# Patient Record
Sex: Female | Born: 2000 | Race: White | Hispanic: No | Marital: Single | Smoking: Never smoker
Health system: Southern US, Community
[De-identification: ages and names within clinical notes are randomized; demographics above are authoritative.]

## PROBLEM LIST (undated history)

## (undated) DIAGNOSIS — I10 Essential (primary) hypertension: Secondary | ICD-10-CM

---

## 2019-08-31 ENCOUNTER — Encounter (HOSPITAL_COMMUNITY): Payer: Self-pay

## 2019-08-31 ENCOUNTER — Other Ambulatory Visit: Payer: Self-pay

## 2019-08-31 ENCOUNTER — Ambulatory Visit (HOSPITAL_COMMUNITY)
Admission: EM | Admit: 2019-08-31 | Discharge: 2019-08-31 | Disposition: A | Discharge status: Home / Self Care | Payer: BC Managed Care – PPO | Attending: Family Medicine | Admitting: Family Medicine

## 2019-08-31 ENCOUNTER — Ambulatory Visit (INDEPENDENT_AMBULATORY_CARE_PROVIDER_SITE_OTHER): Payer: BC Managed Care – PPO

## 2019-08-31 DIAGNOSIS — S52125A Nondisplaced fracture of head of left radius, initial encounter for closed fracture: Secondary | ICD-10-CM

## 2019-08-31 DIAGNOSIS — M25522 Pain in left elbow: Secondary | ICD-10-CM

## 2019-08-31 DIAGNOSIS — M25422 Effusion, left elbow: Secondary | ICD-10-CM

## 2019-08-31 DIAGNOSIS — S8012XA Contusion of left lower leg, initial encounter: Secondary | ICD-10-CM

## 2019-08-31 HISTORY — DX: Essential (primary) hypertension: I10

## 2019-08-31 MED ORDER — HYDROCODONE-ACETAMINOPHEN 5-325 MG PO TABS: 1.0000 | ORAL_TABLET | Freq: Three times a day (TID) | ORAL | 0 refills | Status: AC | PRN

## 2019-08-31 MED ORDER — NAPROXEN 500 MG PO TABS: 500.0000 mg | ORAL_TABLET | Freq: Two times a day (BID) | ORAL | 0 refills | Status: AC

## 2019-08-31 NOTE — ED Triage Notes (Signed)
Pt presents with left elbow injury after landing on it when she was fell off of her skateboard today

## 2019-08-31 NOTE — Discharge Instructions (Addendum)
Please use naproxen twice daily for pain associated with your elbow fracture.  If this does not help to control your pain then it is okay to use hydrocodone up to every 8 hours as needed.  You do not have to take this pain medication if naproxen helps you with your pain control.  Make sure you reach out to [facility] for follow-up and management of your elbow fracture.

## 2019-08-31 NOTE — ED Provider Notes (Signed)
MRN: [id number] DOB: [birth year]  Subjective:   Kathryn Forbes is a 18 y.o. female presenting for suffering a fall today a few hours ago.  Patient was on a skateboard broke her fall with her left elbow and has had severe pain, swelling since then.  She also hit her left leg but denies head trauma.  Denies loss of consciousness, confusion, headache, vision changes, weakness, incontinence, numbness or tingling, difficulty urinating or defecating.  Patient has not taken any medications for pain relief.  She has not had anything to eat since this morning and prefers not to have any medications until she eats dinner.  Denies taking any chronic medications.    No Known Allergies  Past Medical History:  Diagnosis Date  . Hypertension      History reviewed. No pertinent surgical history.  ROS  Objective:   Vitals: BP 135/88 (BP Location: Right Arm)   Pulse 89   Temp 98.4 F (36.9 C) (Oral)   Resp 18   SpO2 100%   Physical Exam Constitutional:      General: She is not in acute distress.    Appearance: Normal appearance. She is well-developed. She is not ill-appearing.  HENT:     Head: Normocephalic and atraumatic.     Nose: Nose normal.     Mouth/Throat:     Mouth: Mucous membranes are moist.     Pharynx: Oropharynx is clear.  Eyes:     General: No scleral icterus.    Extraocular Movements: Extraocular movements intact.     Pupils: Pupils are equal, round, and reactive to light.  Cardiovascular:     Rate and Rhythm: Normal rate.  Pulmonary:     Effort: Pulmonary effort is normal.  Musculoskeletal:     Left elbow: She exhibits decreased range of motion and swelling. She exhibits no deformity and no laceration. Tenderness found. Radial head and lateral epicondyle tenderness noted. No medial epicondyle and no olecranon process tenderness noted.  Skin:    General: Skin is warm and dry.  Neurological:     General: No focal deficit present.     Mental Status: She is alert and  oriented to person, place, and time.     Cranial Nerves: No cranial nerve deficit.     Motor: No weakness.     Coordination: Coordination normal.     Deep Tendon Reflexes: Reflexes normal.  Psychiatric:        Mood and Affect: Mood normal.        Behavior: Behavior normal.        Thought Content: Thought content normal.        Judgment: Judgment normal.     Dg Elbow Complete Left (3+view)  Result Date: 08/31/2019 CLINICAL DATA:  Post fall this morning with persistent left elbow pain. Patient with history of subcutaneous contraceptive device within the upper arm. EXAM: LEFT ELBOW - COMPLETE 3+ VIEW COMPARISON:  None. FINDINGS: There is an acute nondisplaced fracture of the radial head with extension to the adjacent radial- humeral joint and an expected elbow joint effusion. No additional fractures identified. Expected adjacent soft tissue swelling. Linear foreign body within the soft tissues of the inner aspect of the upper arm compatible with implanted contraceptive device. No additional radiopaque foreign body. IMPRESSION: Acute nondisplaced fracture of the radial head with associated elbow joint effusion. Electronically Signed   By: Sandi Mariscal M.D.   On: 08/31/2019 17:02     Assessment and Plan :   1. Closed nondisplaced fracture  of head of left radius, initial encounter   2. Fall from skateboard, initial encounter   3. Pain and swelling of left elbow   4. Contusion of left leg, initial encounter     Patient placed in posterior long-arm splint.  She is to follow-up with Ortho ASAP.  Scheduled naproxen and use hydrocodone for breakthrough pain. Counseled patient on potential for adverse effects with medications prescribed/recommended today, ER and return-to-clinic precautions discussed, patient verbalized understanding.    Wallis Bamberg, [place] 08/31/19 1733

## 2019-09-01 ENCOUNTER — Ambulatory Visit: Payer: BC Managed Care – PPO | Admitting: Orthopaedic Surgery

## 2019-10-09 ENCOUNTER — Other Ambulatory Visit: Payer: Self-pay

## 2019-10-09 DIAGNOSIS — Z20822 Contact with and (suspected) exposure to covid-19: Secondary | ICD-10-CM

## 2019-10-10 LAB — NOVEL CORONAVIRUS, NAA: SARS-CoV-2, NAA: NOT DETECTED

## 2020-02-13 ENCOUNTER — Ambulatory Visit: Payer: BC Managed Care – PPO | Attending: Internal Medicine

## 2020-02-13 DIAGNOSIS — Z23 Encounter for immunization: Secondary | ICD-10-CM

## 2020-02-13 NOTE — Progress Notes (Signed)
   Covid-19 Vaccination Clinic  Name:  Kishia Shackett    MRN: [id number] DOB: 21-Feb-2001  02/13/2020  Ms. Neyer was observed post Covid-19 immunization for 15 minutes without incident. She was provided with Vaccine Information Sheet and instruction to access the V-Safe system.   Ms. Admire was instructed to call 911 with any severe reactions post vaccine: Marland Kitchen Difficulty breathing  . Swelling of face and throat  . A fast heartbeat  . A bad rash all over body  . Dizziness and weakness   Immunizations Administered    Name Date Dose VIS Date Route   Pfizer COVID-19 Vaccine 02/13/2020  6:28 PM 0.3 mL 11/20/2019 Intramuscular   Manufacturer: [employer], [employer]   Lot: [id number]   NDC: [address]-6

## 2020-03-05 ENCOUNTER — Ambulatory Visit: Payer: BC Managed Care – PPO

## 2020-03-07 ENCOUNTER — Ambulatory Visit: Payer: BC Managed Care – PPO | Attending: Internal Medicine

## 2020-03-07 DIAGNOSIS — Z23 Encounter for immunization: Secondary | ICD-10-CM

## 2020-03-07 NOTE — Progress Notes (Signed)
   Covid-19 Vaccination Clinic  Name:  Kathryn Forbes    MRN: [id number] DOB: 17-Oct-2001  03/07/2020  [name] was observed post Covid-19 immunization for 15 minutes without incident. She was provided with Vaccine Information Sheet and instruction to access the V-Safe system.   [name] was instructed to call 911 with any severe reactions post vaccine: Marland Kitchen Difficulty breathing  . Swelling of face and throat  . A fast heartbeat  . A bad rash all over body  . Dizziness and weakness   Immunizations Administered    Name Date Dose VIS Date Route   Pfizer COVID-19 Vaccine 03/07/2020  9:25 AM 0.3 mL 11/20/2019 Intramuscular   Manufacturer: [employer], [employer]   Lot: [id number]   NDC: [address]-2

## 2020-03-16 ENCOUNTER — Ambulatory Visit: Payer: BC Managed Care – PPO

## 2021-04-29 IMAGING — DX DG ELBOW COMPLETE 3+V*L*
4 series · 4 of 4 positions shown · non-contrast
Comparison: None.

CLINICAL DATA: Post fall this morning with persistent left elbow
pain. Patient with history of subcutaneous contraceptive device
within the upper arm.

EXAM:
LEFT ELBOW - COMPLETE 3+ VIEW

[elbow ap]
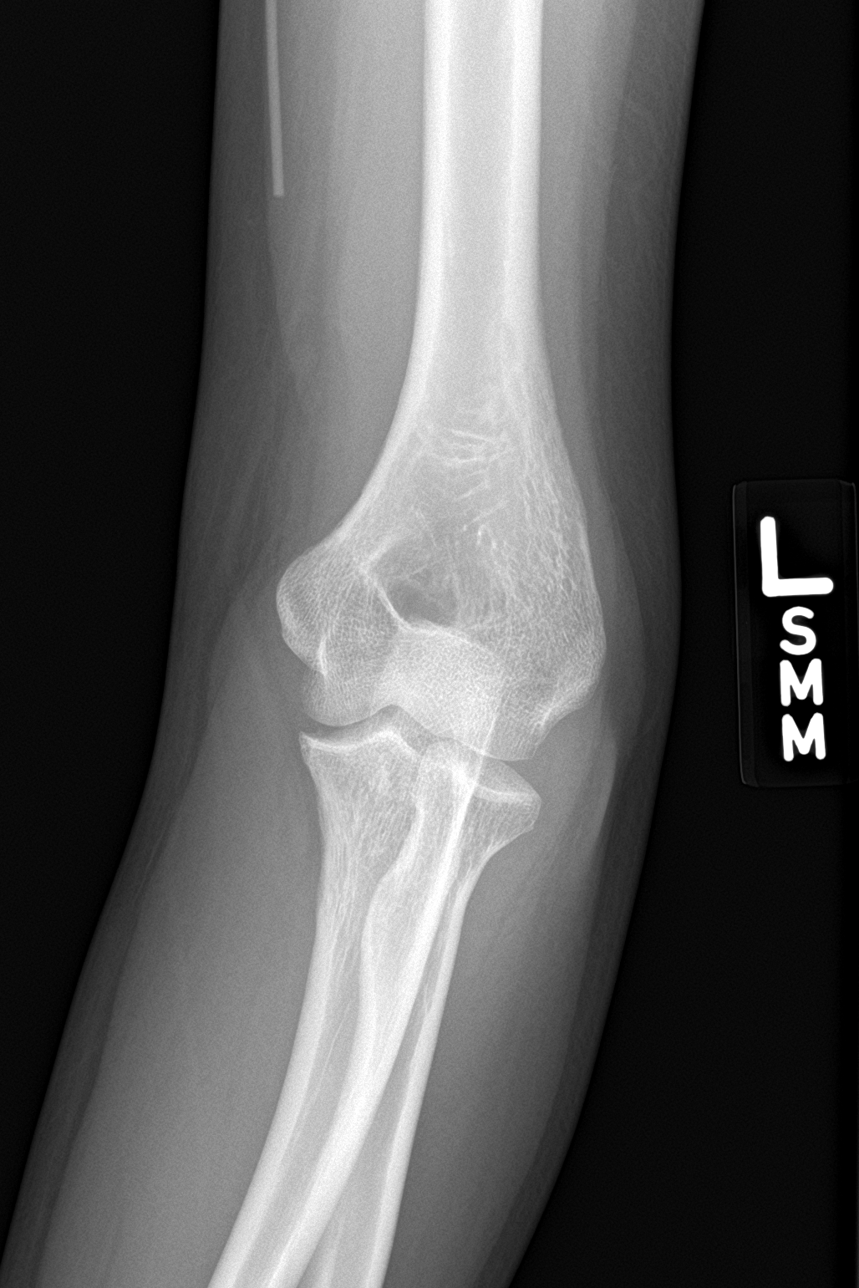

[elbow obl (1 of 2)]
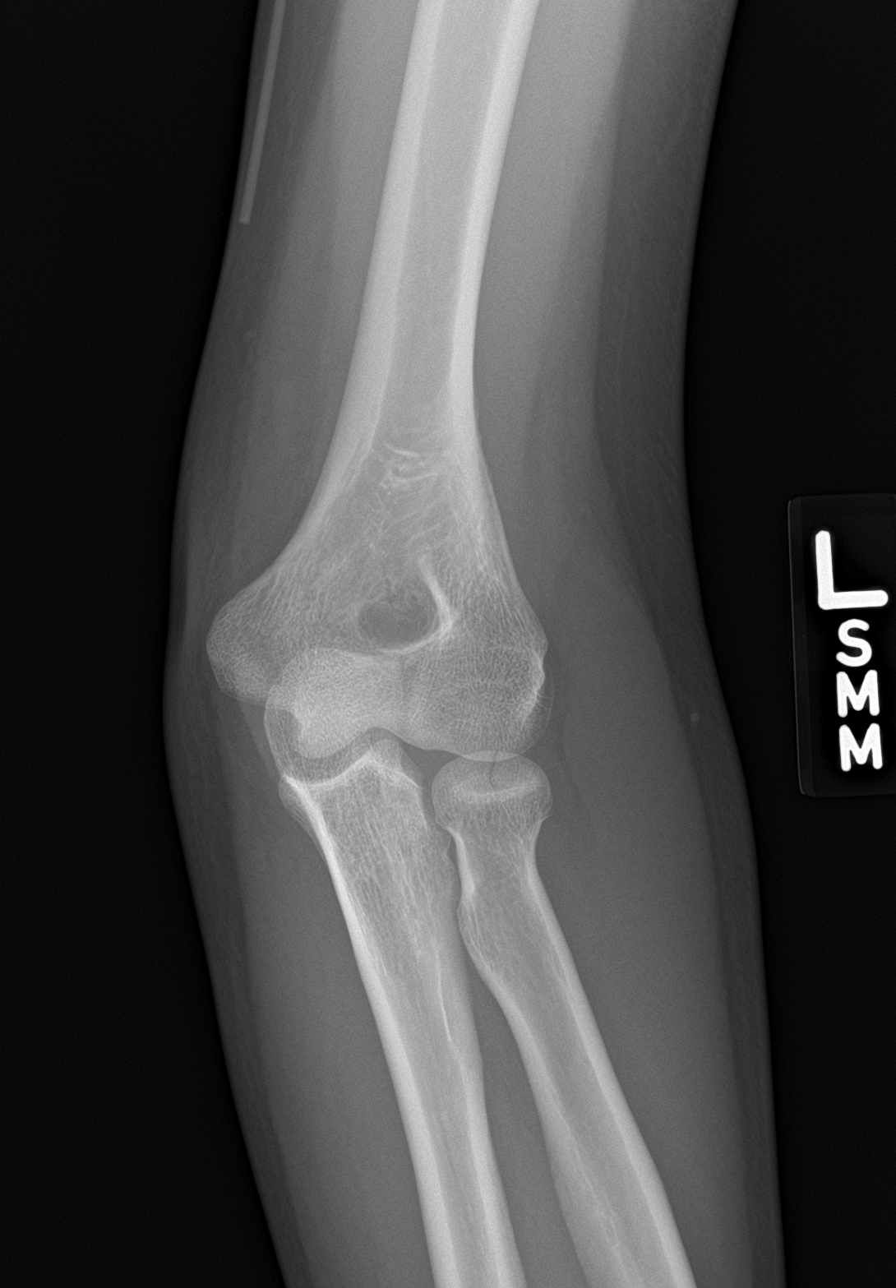

[elbow obl (2 of 2)]
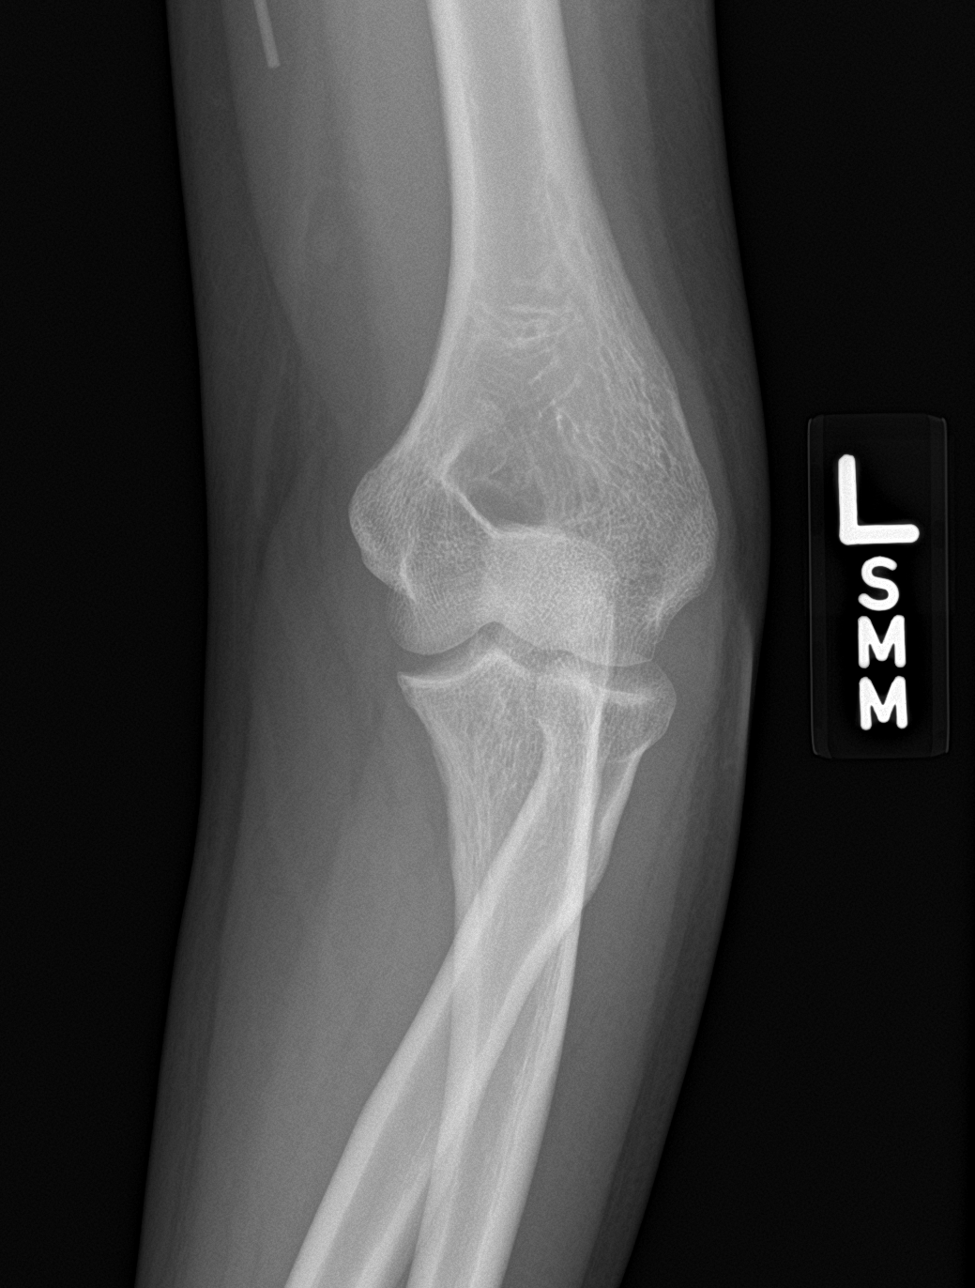

[elbow lat]
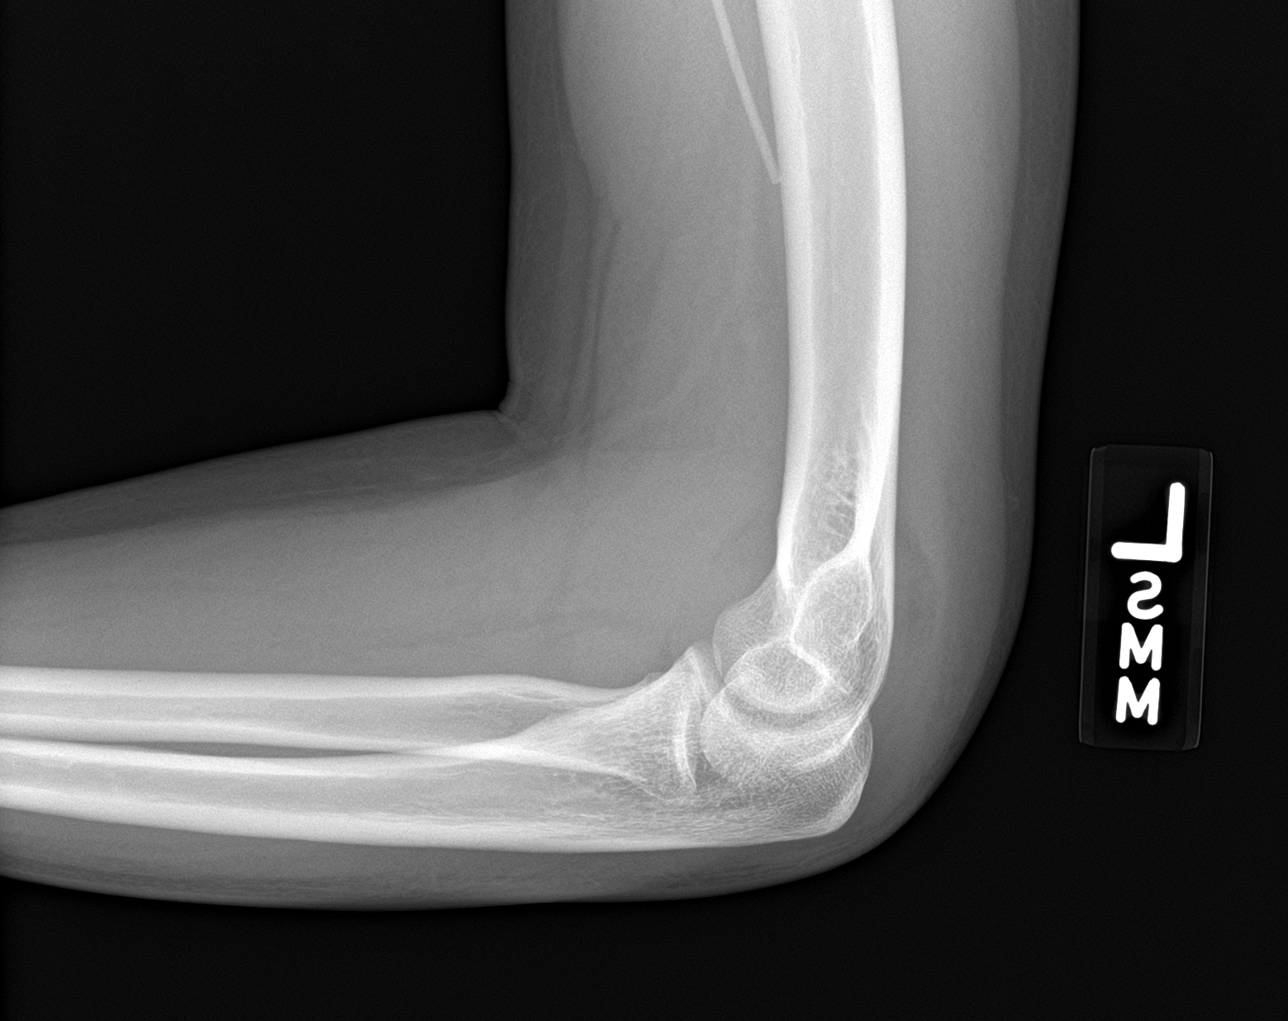

[4 of 4 positions shown; findings below may reference images not displayed]

FINDINGS: There is an acute nondisplaced fracture of the radial head with
extension to the adjacent radial- humeral joint and an expected
elbow joint effusion. No additional fractures identified. Expected
adjacent soft tissue swelling. Linear foreign body within the soft
tissues of the inner aspect of the upper arm compatible with
implanted contraceptive device. No additional radiopaque foreign
body.
IMPRESSION: Acute nondisplaced fracture of the radial head with associated elbow
joint effusion.

## 2022-09-01 ENCOUNTER — Encounter (HOSPITAL_COMMUNITY): Payer: Self-pay | Admitting: Emergency Medicine

## 2022-09-01 ENCOUNTER — Ambulatory Visit (HOSPITAL_COMMUNITY)
Admission: EM | Admit: 2022-09-01 | Discharge: 2022-09-01 | Disposition: A | Discharge status: Home / Self Care | Payer: BC Managed Care – PPO

## 2022-09-01 ENCOUNTER — Other Ambulatory Visit: Payer: Self-pay

## 2022-09-01 DIAGNOSIS — Z0130 Encounter for examination of blood pressure without abnormal findings: Secondary | ICD-10-CM

## 2022-09-01 DIAGNOSIS — Z8249 Family history of ischemic heart disease and other diseases of the circulatory system: Secondary | ICD-10-CM

## 2022-09-01 DIAGNOSIS — Z711 Person with feared health complaint in whom no diagnosis is made: Secondary | ICD-10-CM

## 2022-09-01 NOTE — Discharge Instructions (Addendum)
Your blood pressure is very good. You don't need to check your pressures at home.  Attached is some information about preventing high blood pressure.  Follow up with your primary care provider for further evaluation and management as needed.

## 2022-09-01 NOTE — ED Triage Notes (Addendum)
Patient monitors blood pressure at home. Patient reports at one point had been on a mental health med that also lowered her BP.  She is not taking this medicine any longer ( stopped this medicine 05/2022) and noted pressure today to be 155/106.    Patient denies pain, but does report slight dizziness.  Has noticed headaches for the past 2 weeks, but no headache today  Typically 140/90

## 2022-09-01 NOTE — ED Provider Notes (Signed)
[facility]    CSN: [id number] Arrival date & time: 09/01/22  1437     History   Chief Complaint Chief Complaint  Patient presents with   Hypertension    HPI Kathryn Forbes is a 21 y.o. female.  Presents with concerns for high blood pressure Reports she checks her blood pressure daily at home Has noticed increasing each day, today 155/106  Reports family history of high blood pressure Wondering about starting medication to "get ahead" of it  Denies symptoms   Past Medical History:  Diagnosis Date   Hypertension     There are no problems to display for this patient.   History reviewed. No pertinent surgical history.  OB History   No obstetric history on file.      Home Medications    Prior to Admission medications   Medication Sig Start Date End Date Taking? Authorizing Provider  naproxen (NAPROSYN) 500 MG tablet Take 1 tablet (500 mg total) by mouth 2 (two) times daily. Patient not taking: Reported on 09/01/2022 08/31/19   Wallis Bamberg, PA-C    Family History Family History  Family history unknown: Yes    Social History Social History   Tobacco Use   Smoking status: Never   Smokeless tobacco: Never  Vaping Use   Vaping Use: Never used  Substance Use Topics   Alcohol use: Never   Drug use: Never     Allergies   Penicillins   Review of Systems Review of Systems Per HPI  Physical Exam Triage Vital Signs ED Triage Vitals  Enc Vitals Group     BP 09/01/22 1541 137/89     Pulse Rate 09/01/22 1541 77     Resp 09/01/22 1541 18     Temp 09/01/22 1541 97.9 F (36.6 C)     Temp Source 09/01/22 1541 Oral     SpO2 09/01/22 1541 100 %     Weight --      Height --      Head Circumference --      Peak Flow --      Pain Score 09/01/22 1536 0     Pain Loc --      Pain Edu? --      Excl. in GC? --    No data found.  Updated Vital Signs BP 137/89 (BP Location: Left Arm)   Pulse 77   Temp 97.9 F (36.6 C) (Oral)   Resp 18    SpO2 100%    Physical Exam Vitals and nursing note reviewed.  Constitutional:      General: She is not in acute distress. HENT:     Mouth/Throat:     Pharynx: Uvula midline.     Tonsils: No tonsillar exudate or tonsillar abscesses.  Eyes:     Conjunctiva/sclera: Conjunctivae normal.     Pupils: Pupils are equal, round, and reactive to light.  Cardiovascular:     Rate and Rhythm: Normal rate and regular rhythm.     Heart sounds: Normal heart sounds.  Pulmonary:     Effort: Pulmonary effort is normal.     Breath sounds: Normal breath sounds.  Musculoskeletal:        General: Normal range of motion.  Neurological:     Mental Status: She is alert and oriented to person, place, and time.     UC Treatments / Results  Labs (all labs ordered are listed, but only abnormal results are displayed) Labs Reviewed - No data to display  EKG  Radiology No results found.  Procedures Procedures (including critical care time)  Medications Ordered in UC Medications - No data to display  Initial Impression / Assessment and Plan / UC Course  I have reviewed the triage vital signs and the nursing notes.  Pertinent labs & imaging results that were available during my care of the patient were reviewed by me and considered in my medical decision making (see chart for details).  BP normal. Well appearing.  Patient education regarding BP management, prevention of HBP, provided materials about DASH diet if she would like to try diet/exercise, although at this time she does not meet criteria for hypertension. Discussed that medication is not indicated, especially given normal pressures and we don't want her to become hypotensive. Recommend follow up with PCP as needed Return precautions discussed. Patient agrees to plan  Final Clinical Impressions(s) / UC Diagnoses   Final diagnoses:  Worried well  Family history of hypertension     Discharge Instructions      Your blood pressure is  very good. You don't need to check your pressures at home.  Attached is some information about preventing high blood pressure.  Follow up with your primary care provider for further evaluation and management as needed.    ED Prescriptions   None    PDMP not reviewed this encounter.   Les Pou, [place] 09/01/22 1615
